# Patient Record
Sex: Male | Born: 1975 | Race: White | Hispanic: Yes | Marital: Married | Smoking: Never smoker
Health system: Southern US, Community
[De-identification: ages and names within clinical notes are randomized; demographics above are authoritative.]

## PROBLEM LIST (undated history)

## (undated) HISTORY — PX: ABDOMINAL SURGERY: SHX537

---

## 2012-07-26 ENCOUNTER — Emergency Department (HOSPITAL_COMMUNITY)
Admission: EM | Admit: 2012-07-26 | Discharge: 2012-07-27 | Disposition: A | Discharge status: Home / Self Care | Payer: PRIVATE HEALTH INSURANCE | Attending: Emergency Medicine | Admitting: Emergency Medicine

## 2012-07-26 ENCOUNTER — Encounter (HOSPITAL_COMMUNITY): Payer: Self-pay | Admitting: *Deleted

## 2012-07-26 DIAGNOSIS — R1013 Epigastric pain: Secondary | ICD-10-CM

## 2012-07-26 DIAGNOSIS — R109 Unspecified abdominal pain: Secondary | ICD-10-CM | POA: Insufficient documentation

## 2012-07-26 LAB — URINE MICROSCOPIC-ADD ON

## 2012-07-26 LAB — URINALYSIS, ROUTINE W REFLEX MICROSCOPIC
Bilirubin Urine: NEGATIVE
Leukocytes, UA: NEGATIVE
Nitrite: NEGATIVE
Specific Gravity, Urine: >1.03 — ABNORMAL HIGH (ref 1.005–1.030)
pH: 5.5 (ref 5.0–8.0)

## 2012-07-26 MED ORDER — KETOROLAC TROMETHAMINE 60 MG/2ML IM SOLN
60.0000 mg | Freq: Once | INTRAMUSCULAR | Status: AC
  Administered 2012-07-26: 60 mg via INTRAMUSCULAR
  Filled 2012-07-26: qty 2

## 2012-07-26 NOTE — ED Provider Notes (Signed)
History     CSN: [id number]  Arrival date & time 07/26/12  2126   First MD Initiated Contact with Patient 07/26/12 2334      Chief Complaint  Patient presents with  . Abdominal Pain    (Consider location/radiation/quality/duration/timing/severity/associated sxs/prior treatment) Patient is a 37 y.o. male presenting with abdominal pain. The history is provided by the patient.  Abdominal Pain Pain location:  Epigastric, LUQ and L flank Pain quality: sharp   Pain radiates to:  Back Pain severity:  Moderate Onset quality:  Gradual Duration:  4 weeks Timing:  Intermittent Progression:  Worsening Chronicity:  New Context: eating   Context: not alcohol use and not diet changes   Relieved by:  Nothing Worsened by:  Nothing tried Ineffective treatments:  None tried Associated symptoms: belching   Associated symptoms: no chills, no fever, no shortness of breath and no vomiting   Risk factors: no alcohol abuse and no NSAID use     History reviewed. No pertinent past medical history.  History reviewed. No pertinent past surgical history.  No family history on file.  History  Substance Use Topics  . Smoking status: Never Smoker   . Smokeless tobacco: Not on file  . Alcohol Use: Yes      Review of Systems  Constitutional: Negative for fever and chills.  Respiratory: Negative for shortness of breath.   Gastrointestinal: Positive for abdominal pain. Negative for vomiting.  All other systems reviewed and are negative.    Allergies  Review of patient's allergies indicates no known allergies.  Home Medications  No current outpatient prescriptions on file.  BP 130/73  Pulse 67  Temp(Src) 97.7 F (36.5 C) (Oral)  Resp 20  Ht 5\' 2"  (1.575 m)  Wt 189 lb (85.73 kg)  BMI 34.56 kg/m2  SpO2 99%  Physical Exam  Nursing note and vitals reviewed. Constitutional: He is oriented to person, place, and time. He appears well-developed and well-nourished. No distress.  HENT:   Head: Normocephalic and atraumatic.  Mouth/Throat: Oropharynx is clear and moist.  Neck: Normal range of motion. Neck supple.  Cardiovascular: Normal rate and regular rhythm.   No murmur heard. Pulmonary/Chest: Effort normal and breath sounds normal. No respiratory distress. He has no wheezes.  Abdominal: Soft. Bowel sounds are normal. He exhibits no distension. There is tenderness. There is no rebound and no guarding.  There is ttp in the epigastrium and left upper quadrant.    Musculoskeletal: Normal range of motion. He exhibits no edema.  Neurological: He is alert and oriented to person, place, and time.  Skin: Skin is warm and dry. He is not diaphoretic.    ED Course  Procedures (including critical care time)  Labs Reviewed  URINALYSIS, ROUTINE W REFLEX MICROSCOPIC - Abnormal; Notable for the following:    Specific Gravity, Urine >1.030 (*)    Hgb urine dipstick SMALL (*)    Protein, ur 30 (*)    All other components within normal limits  URINE MICROSCOPIC-ADD ON - Abnormal; Notable for the following:    Squamous Epithelial / LPF FEW (*)    All other components within normal limits  CBC WITH DIFFERENTIAL  COMPREHENSIVE METABOLIC PANEL  LIPASE, BLOOD   No results found.   No diagnosis found.    MDM  The patient presents with epigastric, LUQ, and left flank discomfort for the past several weeks. It is worse with eating and he belches a lot after he eats which seems to help.  The workup reveals  normal labs, ct scan, and urine that shows 7-10 wbc.  I suspect the symptoms are related to GERD / gastritis as the workup fails to reveal an alternate cause.  Will treat with prilosec, cipro and prn follow up if he worsens.          Sudie Grumbling, MD 07/27/12 [phone]

## 2012-07-26 NOTE — ED Notes (Signed)
Pain in left flank radiating into abdominal area, states pain is worse when he drinks water, states he has burning on urination

## 2012-07-26 NOTE — ED Notes (Signed)
Pt c/o LUQ pain that radiates to back. Pt states pain has been intermittent x2 months but this time it is worse. Pt denies NVD and chest pain.

## 2012-07-27 ENCOUNTER — Emergency Department (HOSPITAL_COMMUNITY): Payer: PRIVATE HEALTH INSURANCE

## 2012-07-27 LAB — CBC WITH DIFFERENTIAL/PLATELET
Basophils Absolute: 0 10*3/uL (ref 0.0–0.1)
Eosinophils Absolute: 0.3 10*3/uL (ref 0.0–0.7)
Eosinophils Relative: 3 % (ref 0–5)
HCT: 41 % (ref 39.0–52.0)
MCH: 28.8 pg (ref 26.0–34.0)
MCHC: 33.9 g/dL (ref 30.0–36.0)
MCV: 84.9 fL (ref 78.0–100.0)
Monocytes Absolute: 0.8 10*3/uL (ref 0.1–1.0)
Platelets: 161 10*3/uL (ref 150–400)
RDW: 13.2 % (ref 11.5–15.5)

## 2012-07-27 LAB — COMPREHENSIVE METABOLIC PANEL
ALT: 18 U/L (ref 0–53)
AST: 16 U/L (ref 0–37)
Calcium: 9.3 mg/dL (ref 8.4–10.5)
Creatinine, Ser: 0.72 mg/dL (ref 0.50–1.35)
GFR calc Af Amer: >90 mL/min (ref >90)
Sodium: 139 mEq/L (ref 135–145)
Total Protein: 7.5 g/dL (ref 6.0–8.3)

## 2012-07-27 MED ORDER — OMEPRAZOLE 20 MG PO CPDR: 20.0000 mg | DELAYED_RELEASE_CAPSULE | Freq: Two times a day (BID) | ORAL | Status: DC

## 2012-07-27 MED ORDER — CIPROFLOXACIN HCL 500 MG PO TABS: 500.0000 mg | ORAL_TABLET | Freq: Two times a day (BID) | ORAL | Status: DC

## 2013-07-08 ENCOUNTER — Encounter (HOSPITAL_COMMUNITY): Payer: Self-pay | Admitting: Emergency Medicine

## 2013-07-08 ENCOUNTER — Emergency Department (HOSPITAL_COMMUNITY): Payer: PRIVATE HEALTH INSURANCE

## 2013-07-08 ENCOUNTER — Inpatient Hospital Stay (HOSPITAL_COMMUNITY)
Admission: EM | Admit: 2013-07-08 | Discharge: 2013-07-10 | DRG: 390 | Disposition: A | Discharge status: Home / Self Care | Payer: PRIVATE HEALTH INSURANCE | Attending: General Surgery | Admitting: General Surgery

## 2013-07-08 DIAGNOSIS — K565 Intestinal adhesions [bands], unspecified as to partial versus complete obstruction: Secondary | ICD-10-CM | POA: Diagnosis not present

## 2013-07-08 DIAGNOSIS — K56600 Partial intestinal obstruction, unspecified as to cause: Secondary | ICD-10-CM | POA: Diagnosis present

## 2013-07-08 DIAGNOSIS — R112 Nausea with vomiting, unspecified: Secondary | ICD-10-CM | POA: Diagnosis not present

## 2013-07-08 NOTE — ED Notes (Signed)
Pt reporting generalized abdominal pain with associated nausea and vomiting, starting about noon today.

## 2013-07-09 ENCOUNTER — Emergency Department (HOSPITAL_COMMUNITY): Payer: PRIVATE HEALTH INSURANCE

## 2013-07-09 ENCOUNTER — Encounter (HOSPITAL_COMMUNITY): Payer: Self-pay | Admitting: *Deleted

## 2013-07-09 DIAGNOSIS — R112 Nausea with vomiting, unspecified: Secondary | ICD-10-CM | POA: Diagnosis present

## 2013-07-09 DIAGNOSIS — K56600 Partial intestinal obstruction, unspecified as to cause: Secondary | ICD-10-CM | POA: Diagnosis present

## 2013-07-09 DIAGNOSIS — K565 Intestinal adhesions [bands], unspecified as to partial versus complete obstruction: Secondary | ICD-10-CM | POA: Diagnosis present

## 2013-07-09 LAB — COMPREHENSIVE METABOLIC PANEL
ALBUMIN: 4.6 g/dL (ref 3.5–5.2)
ALK PHOS: 95 U/L (ref 39–117)
ALT: 14 U/L (ref 0–53)
AST: 16 U/L (ref 0–37)
BUN: 20 mg/dL (ref 6–23)
CO2: 29 mEq/L (ref 19–32)
CREATININE: 0.6 mg/dL (ref 0.50–1.35)
Calcium: 10.3 mg/dL (ref 8.4–10.5)
Chloride: 98 mEq/L (ref 96–112)
GFR calc Af Amer: >90 mL/min (ref >90)
GFR calc non Af Amer: >90 mL/min (ref >90)
Glucose, Bld: 137 mg/dL — ABNORMAL HIGH (ref 70–99)
POTASSIUM: 3.5 meq/L — AB (ref 3.7–5.3)
Sodium: 140 mEq/L (ref 137–147)
TOTAL PROTEIN: 8.5 g/dL — AB (ref 6.0–8.3)
Total Bilirubin: 0.6 mg/dL (ref 0.3–1.2)

## 2013-07-09 LAB — URINALYSIS, ROUTINE W REFLEX MICROSCOPIC
Bilirubin Urine: NEGATIVE
Glucose, UA: NEGATIVE mg/dL
Hgb urine dipstick: NEGATIVE
Ketones, ur: NEGATIVE mg/dL
LEUKOCYTES UA: NEGATIVE
NITRITE: NEGATIVE
PH: 6 (ref 5.0–8.0)
Protein, ur: 100 mg/dL — AB
Urobilinogen, UA: 0.2 mg/dL (ref 0.0–1.0)

## 2013-07-09 LAB — LIPASE, BLOOD: LIPASE: 19 U/L (ref 11–59)

## 2013-07-09 LAB — URINE MICROSCOPIC-ADD ON

## 2013-07-09 LAB — CBC
HEMATOCRIT: 44.5 % (ref 39.0–52.0)
Hemoglobin: 15.1 g/dL (ref 13.0–17.0)
MCH: 28.5 pg (ref 26.0–34.0)
MCHC: 33.9 g/dL (ref 30.0–36.0)
MCV: 84.1 fL (ref 78.0–100.0)
Platelets: 177 10*3/uL (ref 150–400)
RBC: 5.29 MIL/uL (ref 4.22–5.81)
RDW: 13.7 % (ref 11.5–15.5)
WBC: 9.4 10*3/uL (ref 4.0–10.5)

## 2013-07-09 MED ORDER — ONDANSETRON HCL 4 MG/2ML IJ SOLN
4.0000 mg | Freq: Once | INTRAMUSCULAR | Status: AC
  Administered 2013-07-09: 4 mg via INTRAVENOUS
  Filled 2013-07-09: qty 2

## 2013-07-09 MED ORDER — OXYCODONE HCL 5 MG PO TABS
5.0000 mg | ORAL_TABLET | ORAL | Status: DC | PRN
  Administered 2013-07-09 (×2): 5 mg via ORAL
  Filled 2013-07-09 (×2): qty 1

## 2013-07-09 MED ORDER — ACETAMINOPHEN 325 MG PO TABS: 650.0000 mg | ORAL_TABLET | Freq: Four times a day (QID) | ORAL | Status: DC | PRN

## 2013-07-09 MED ORDER — DIPHENHYDRAMINE HCL 50 MG/ML IJ SOLN
12.5000 mg | Freq: Four times a day (QID) | INTRAMUSCULAR | Status: DC | PRN
Start: 2013-07-09 — End: 2013-07-10

## 2013-07-09 MED ORDER — ONDANSETRON HCL 4 MG/2ML IJ SOLN: 4.0000 mg | Freq: Four times a day (QID) | INTRAMUSCULAR | Status: DC | PRN

## 2013-07-09 MED ORDER — ACETAMINOPHEN 650 MG RE SUPP: 650.0000 mg | Freq: Four times a day (QID) | RECTAL | Status: DC | PRN

## 2013-07-09 MED ORDER — FENTANYL CITRATE 0.05 MG/ML IJ SOLN: 50.0000 ug | INTRAMUSCULAR | Status: DC | PRN

## 2013-07-09 MED ORDER — FENTANYL CITRATE 0.05 MG/ML IJ SOLN
50.0000 ug | INTRAMUSCULAR | Status: DC | PRN
  Administered 2013-07-09 (×2): 50 ug via INTRAVENOUS
  Filled 2013-07-09 (×2): qty 2

## 2013-07-09 MED ORDER — SODIUM CHLORIDE 0.9 % IV SOLN
INTRAVENOUS | Status: DC
  Administered 2013-07-09: 04:00:00 via INTRAVENOUS

## 2013-07-09 MED ORDER — ONDANSETRON HCL 4 MG/2ML IJ SOLN: 4.0000 mg | Freq: Three times a day (TID) | INTRAMUSCULAR | Status: DC | PRN

## 2013-07-09 MED ORDER — SODIUM CHLORIDE 0.9 % IV SOLN
INTRAVENOUS | Status: DC
  Administered 2013-07-09 (×2): via INTRAVENOUS

## 2013-07-09 MED ORDER — ENOXAPARIN SODIUM 40 MG/0.4ML ~~LOC~~ SOLN
40.0000 mg | Freq: Every day | SUBCUTANEOUS | Status: DC
  Administered 2013-07-09: 40 mg via SUBCUTANEOUS
  Filled 2013-07-09: qty 0.4

## 2013-07-09 MED ORDER — DIPHENHYDRAMINE HCL 12.5 MG/5ML PO ELIX
12.5000 mg | ORAL_SOLUTION | Freq: Four times a day (QID) | ORAL | Status: DC | PRN
Start: 2013-07-09 — End: 2013-07-10

## 2013-07-09 MED ORDER — IOHEXOL 300 MG/ML  SOLN
100.0000 mL | Freq: Once | INTRAMUSCULAR | Status: AC | PRN
  Administered 2013-07-09: 100 mL via INTRAVENOUS

## 2013-07-09 MED ORDER — IOHEXOL 300 MG/ML  SOLN
50.0000 mL | Freq: Once | INTRAMUSCULAR | Status: AC | PRN
  Administered 2013-07-09: 50 mL via ORAL

## 2013-07-09 MED ORDER — KCL IN DEXTROSE-NACL 20-5-0.45 MEQ/L-%-% IV SOLN
INTRAVENOUS | Status: DC
  Administered 2013-07-09 – 2013-07-10 (×2): via INTRAVENOUS

## 2013-07-09 MED ORDER — HYDROMORPHONE HCL PF 1 MG/ML IJ SOLN
1.0000 mg | INTRAMUSCULAR | Status: DC | PRN
  Administered 2013-07-09 (×2): 1 mg via INTRAVENOUS
  Filled 2013-07-09 (×3): qty 1

## 2013-07-09 NOTE — ED Provider Notes (Signed)
CSN: [id number]     Arrival date & time 07/08/13  2305 History  This chart was scribed for Bryce Nielsen, MD by Bennett Scrape, ED Scribe. This patient was seen in room APA05/APA05 and the patient's care was started at 12:00 AM.    Chief Complaint  Patient presents with  . Abdominal Pain     Patient is a 38 y.o. male presenting with abdominal pain. The history is provided by the patient. No language interpreter was used.  Abdominal Pain Pain location:  LUQ and RUQ Pain radiates to:  Does not radiate Pain severity:  Moderate Onset quality:  Gradual Duration:  12 hours Timing:  Constant Chronicity:  New Associated symptoms: chills, nausea and vomiting   Associated symptoms: no chest pain, no diarrhea, no fever and no shortness of breath     HPI Comments: Bryce Wilson is a 38 y.o. male who presents to the Emergency Department complaining of RUQ and LUQ abdominal pain that started around 12 PM today with associated nausea, emesis and chills. He is having pain currently. Pain is sharp in quality moderate to severe. No known alleviating factors. He denies any sick contacts with similar symptoms. He denies any fevers, hematemesis, hematochezia, back pain or diarrhea. Family reports prior abdominal sugery after the pt fell off of a roof and had states he had internal bleeding with surgery in November at [facility]. He denies complications since surgery. He is not uncertain about any further details of the surgery.    History reviewed. No pertinent past medical history. Past Surgical History  Procedure Laterality Date  . Abdominal surgery     History reviewed. No pertinent family history. History  Substance Use Topics  . Smoking status: Never Smoker   . Smokeless tobacco: Not on file  . Alcohol Use: No    Review of Systems  Constitutional: Positive for chills. Negative for fever.  Respiratory: Negative for shortness of breath.   Cardiovascular: Negative for chest pain.   Gastrointestinal: Positive for nausea, vomiting and abdominal pain. Negative for diarrhea and blood in stool.  Genitourinary: Negative for flank pain.  Musculoskeletal: Negative for back pain.  Skin: Negative for rash.  Neurological: Negative for weakness and numbness.  All other systems reviewed and are negative.    Allergies  Review of patient's allergies indicates no known allergies. Morphine causes hyperemesis per pt at bedside  Home Medications   Current Outpatient Rx  Name  Route  Sig  Dispense  Refill  . ciprofloxacin (CIPRO) 500 MG tablet   Oral   Take 1 tablet (500 mg total) by mouth every 12 (twelve) hours.   10 tablet   0   . omeprazole (PRILOSEC) 20 MG capsule   Oral   Take 1 capsule (20 mg total) by mouth 2 (two) times daily.   60 capsule   0    Triage Vitals: BP 131/81  Pulse 63  Temp(Src) 98.1 F (36.7 C) (Oral)  Resp 20  Ht 5\' 3"  (1.6 m)  Wt 165 lb (74.844 kg)  BMI 29.24 kg/m2  SpO2 98%  Physical Exam  Nursing note and vitals reviewed. Constitutional: He is oriented to person, place, and time. He appears well-developed and well-nourished. No distress.  HENT:  Head: Normocephalic and atraumatic.  Eyes: EOM are normal.  Neck: Neck supple. No tracheal deviation present.  Cardiovascular: Normal rate and regular rhythm.   Pulmonary/Chest: Effort normal and breath sounds normal. No respiratory distress.  Abdominal: Soft. There is tenderness (epigastric, LUQ and RUQ).  No CVAT, no lower abdominal tenderness   Musculoskeletal: Normal range of motion.  Neurological: He is alert and oriented to person, place, and time.  Skin: Skin is warm and dry.  Psychiatric: He has a normal mood and affect. His behavior is normal.    ED Course  Procedures (including critical care time)  DIAGNOSTIC STUDIES: Oxygen Saturation is 98% on RA, normal by my interpretation.    COORDINATION OF CARE: 12:02 AM-Discussed treatment plan which includes abdomen x-ray, CBC  panel, CMP and UA with pt at bedside and pt agreed to plan.   Labs Review Labs Reviewed  COMPREHENSIVE METABOLIC PANEL - Abnormal; Notable for the following:    Potassium 3.5 (*)    Glucose, Bld 137 (*)    Total Protein 8.5 (*)    All other components within normal limits  URINALYSIS, ROUTINE W REFLEX MICROSCOPIC - Abnormal; Notable for the following:    Specific Gravity, Urine >1.030 (*)    Protein, ur 100 (*)    All other components within normal limits  URINE MICROSCOPIC-ADD ON - Abnormal; Notable for the following:    Squamous Epithelial / LPF FEW (*)    Bacteria, UA FEW (*)    All other components within normal limits  CBC  LIPASE, BLOOD   Imaging Review Ct Abdomen Pelvis W Contrast  07/09/2013   CLINICAL DATA:  Upper abdominal pain and emesis.  EXAM: CT ABDOMEN AND PELVIS WITH CONTRAST  TECHNIQUE: Multidetector CT imaging of the abdomen and pelvis was performed using the standard protocol following bolus administration of intravenous contrast.  CONTRAST:  50mL OMNIPAQUE IOHEXOL 300 MG/ML SOLN, OMNIPAQUE IOHEXOL 300 MG/ML SOLN  COMPARISON:  DG ABD ACUTE W/CHEST dated 07/08/2013; CT ABD/PELV WO CM dated 07/27/2012  FINDINGS: Minimal dependent atelectasis is noted in the lung bases. There is no pleural effusion.  The liver, gallbladder, spleen, right adrenal gland, kidneys, and pancreas have an unremarkable enhanced appearance. Slight thickening of the left adrenal gland is unchanged.  The stomach is moderately distended with oral contrast material. Oral contrast is present in multiple loops of proximal small bowel which are dilated up to 3.8 cm in diameter. There is a transition to decompressed small bowel which occurs in the central abdomen. There is a small to moderate amount of stool in the colon.  No intraperitoneal free fluid or free air is identified. The bladder is unremarkable. No enlarged lymph nodes are identified. No acute osseous abnormality is seen.  IMPRESSION: Dilated  proximal small bowel loops with decompressed distal small bowel, concerning for partial obstruction.   Electronically Signed   By: Sebastian Ache   On: 07/09/2013 02:03   Dg Abd Acute W/chest  07/09/2013   CLINICAL DATA:  Abdominal pain and emesis.  EXAM: ACUTE ABDOMEN SERIES (ABDOMEN 2 VIEW & CHEST 1 VIEW)  COMPARISON:  CT ABD/PELV WO CM dated 07/27/2012  FINDINGS: Cardiomediastinal silhouette is unremarkable. Lungs are clear, no pleural effusions. Mildly elevated right hemidiaphragm. No pneumothorax. Soft tissue planes and included osseous structures are unremarkable.  Bowel gas pattern is nondilated and nonobstructive. Overall paucity of small bowel gas is not specific. No intra-abdominal mass effect, pathologic calcifications or free air. Soft tissue planes and included osseous structures are nonsuspicious.  IMPRESSION: Nonspecific bowel gas pattern .  No acute cardiopulmonary disease.   Electronically Signed   By: Awilda Metro   On: 07/09/2013 00:18   IV fluids provided. IV fentanyl. IV Zofran. On recheck still having some pain but nausea resolved.  Discussed with Dr. Lovell SheehanJenkins, plan admit MedSurg - will hold off on NG tube at this time.  MDM   Diagnosis: Partial small bowel obstruction  Evaluated with labs and imaging reviewed as above. Treated with IV fluids and IV narcotics. Nausea improved with IV Zofran.  I personally performed the services described in this documentation, which was scribed in my presence. The recorded information has been reviewed and is accurate.    Bryce NielsenBrian Deion Swift, MD 07/09/13 [phone]

## 2013-07-09 NOTE — ED Notes (Signed)
Patient ambulatory to restroom with steady gait, clean catch instructions given and advised pt to bring specimen back to room as well.  

## 2013-07-09 NOTE — H&P (Signed)
Bryce Wilson is an 38 y.o. male.   Chief Complaint: Small bowel obstruction HPI: Patient is a 38 year old Hispanic male status post exploratory laparotomy for intra-abdominal bleeding after sustaining a fall from a roof last November 2014 who presents with a 48 hour history of worsening abdominal distention and pain, nausea, vomiting. He states he has had flatus within the last 24 hours. Did have a bowel movement yesterday. He presented to the [facility] emergency room for further evaluation treatment. 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 He also states he has had an open appendectomy in the remote past. Currently, his abdominal pain has resolved and he only complains of intermittent crampy abdominal pain.  History reviewed. No pertinent past medical history.  Past Surgical History  Procedure Laterality Date  . Abdominal surgery      History reviewed. No pertinent family history. Social History:  reports that he has never smoked. He does not have any smokeless tobacco history on file. He reports that he does not drink alcohol or use illicit drugs.  Allergies: No Known Allergies  No prescriptions prior to admission    Results for orders placed during the hospital encounter of 07/08/13 (from the past 48 hour(s))  CBC     Status: None   Collection Time    07/09/13 12:05 AM      Result Value Ref Range   WBC 9.4  4.0 - 10.5 K/uL   RBC 5.29  4.22 - 5.81 MIL/uL   Hemoglobin 15.1  13.0 - 17.0 g/dL   HCT 44.5  39.0 - 52.0 %   MCV 84.1  78.0 - 100.0 fL   MCH 28.5  26.0 - 34.0 pg   MCHC 33.9  30.0 - 36.0 g/dL   RDW 13.7  11.5 - 15.5 %   Platelets 177  150 - 400 K/uL  COMPREHENSIVE METABOLIC PANEL     Status: Abnormal   Collection Time    07/09/13 12:05 AM      Result Value Ref Range   Sodium 140  137 - 147 mEq/L   Potassium 3.5 (*) 3.7 - 5.3 mEq/L   Chloride 98  96 - 112 mEq/L   CO2 29  19 - 32 mEq/L   Glucose, Bld 137  (*) 70 - 99 mg/dL   BUN 20  6 - 23 mg/dL   Creatinine, Ser 0.60  0.50 - 1.35 mg/dL   Calcium 10.3  8.4 - 10.5 mg/dL   Total Protein 8.5 (*) 6.0 - 8.3 g/dL   Albumin 4.6  3.5 - 5.2 g/dL   AST 16  0 - 37 U/L   ALT 14  0 - 53 U/L   Alkaline Phosphatase 95  39 - 117 U/L   Total Bilirubin 0.6  0.3 - 1.2 mg/dL   GFR calc non Af Amer >90  >90 mL/min   GFR calc Af Amer >90  >90 mL/min   Comment: (NOTE)     The eGFR has been calculated using the CKD EPI equation.     This calculation has not been validated in all clinical situations.     eGFR's persistently <90 mL/min signify possible Chronic Kidney     Disease.  LIPASE, BLOOD     Status: None   Collection Time    07/09/13 12:05 AM      Result Value Ref Range   Lipase 19  11 - 59 U/L  URINALYSIS, ROUTINE W REFLEX MICROSCOPIC  Status: Abnormal   Collection Time    07/09/13  1:05 AM      Result Value Ref Range   Color, Urine YELLOW  YELLOW   APPearance CLEAR  CLEAR   Specific Gravity, Urine >1.030 (*) 1.005 - 1.030   pH 6.0  5.0 - 8.0   Glucose, UA NEGATIVE  NEGATIVE mg/dL   Hgb urine dipstick NEGATIVE  NEGATIVE   Bilirubin Urine NEGATIVE  NEGATIVE   Ketones, ur NEGATIVE  NEGATIVE mg/dL   Protein, ur 100 (*) NEGATIVE mg/dL   Urobilinogen, UA 0.2  0.0 - 1.0 mg/dL   Nitrite NEGATIVE  NEGATIVE   Leukocytes, UA NEGATIVE  NEGATIVE  URINE MICROSCOPIC-ADD ON     Status: Abnormal   Collection Time    07/09/13  1:05 AM      Result Value Ref Range   Squamous Epithelial / LPF FEW (*) RARE   WBC, UA 0-2  <3 WBC/hpf   RBC / HPF 0-2  <3 RBC/hpf   Bacteria, UA FEW (*) RARE   Urine-Other MUCOUS PRESENT     Ct Abdomen Pelvis W Contrast  07/09/2013   CLINICAL DATA:  Upper abdominal pain and emesis.  EXAM: CT ABDOMEN AND PELVIS WITH CONTRAST  TECHNIQUE: Multidetector CT imaging of the abdomen and pelvis was performed using the standard protocol following bolus administration of intravenous contrast.  CONTRAST:  82m OMNIPAQUE IOHEXOL 300 MG/ML  SOLN, 1055mOMNIPAQUE IOHEXOL 300 MG/ML SOLN  COMPARISON:  DG ABD ACUTE W/CHEST dated 07/08/2013; CT ABD/PELV WO CM dated 07/27/2012  FINDINGS: Minimal dependent atelectasis is noted in the lung bases. There is no pleural effusion.  The liver, gallbladder, spleen, right adrenal gland, kidneys, and pancreas have an unremarkable enhanced appearance. Slight thickening of the left adrenal gland is unchanged.  The stomach is moderately distended with oral contrast material. Oral contrast is present in multiple loops of proximal small bowel which are dilated up to 3.8 cm in diameter. There is a transition to decompressed small bowel which occurs in the central abdomen. There is a small to moderate amount of stool in the colon.  No intraperitoneal free fluid or free air is identified. The bladder is unremarkable. No enlarged lymph nodes are identified. No acute osseous abnormality is seen.  IMPRESSION: Dilated proximal small bowel loops with decompressed distal small bowel, concerning for partial obstruction.   Electronically Signed   By: AlLogan Bores On: 07/09/2013 02:03   Dg Abd Acute W/chest  07/09/2013   CLINICAL DATA:  Abdominal pain and emesis.  EXAM: ACUTE ABDOMEN SERIES (ABDOMEN 2 VIEW & CHEST 1 VIEW)  COMPARISON:  CT ABD/PELV WO CM dated 07/27/2012  FINDINGS: Cardiomediastinal silhouette is unremarkable. Lungs are clear, no pleural effusions. Mildly elevated right hemidiaphragm. No pneumothorax. Soft tissue planes and included osseous structures are unremarkable.  Bowel gas pattern is nondilated and nonobstructive. Overall paucity of small bowel gas is not specific. No intra-abdominal mass effect, pathologic calcifications or free air. Soft tissue planes and included osseous structures are nonsuspicious.  IMPRESSION: Nonspecific bowel gas pattern .  No acute cardiopulmonary disease.   Electronically Signed   By: CoElon Alas On: 07/09/2013 00:18    Review of Systems  Constitutional: Positive for  malaise/fatigue.  HENT: Negative.   Respiratory: Negative.   Cardiovascular: Negative.   Gastrointestinal: Positive for nausea, vomiting and abdominal pain.  Genitourinary: Negative.   Musculoskeletal: Negative.   Skin: Negative.   All other systems reviewed and are negative.  Blood pressure 110/67, pulse 63, temperature 97.9 F (36.6 C), temperature source Oral, resp. rate 20, height '5\' 3"'  (1.6 m), weight 79.606 kg (175 lb 8 oz), SpO2 96.00%. Physical Exam  Vitals reviewed. Constitutional: He is oriented to person, place, and time. He appears well-developed and well-nourished.  HENT:  Head: Normocephalic and atraumatic.  Neck: Normal range of motion. Neck supple.  Cardiovascular: Normal rate, regular rhythm and normal heart sounds.   Respiratory: Effort normal and breath sounds normal.  GI: Soft. Bowel sounds are normal. He exhibits no distension. There is no rebound and no guarding.  Minimally tender in the midportion the abdomen. No rigidity is noted. Bowel sounds are appreciated.  Neurological: He is alert and oriented to person, place, and time.  Skin: Skin is warm and dry.     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 No need for NG tube decompression at this time. No need for acute intervention at this time. Discussed the situation with the patient and family, who understand and agree to the treatment plan.  Janine Reller A 07/09/2013, 12:11 PM

## 2013-07-09 NOTE — Progress Notes (Signed)
UR completed. Patient changed to inpaitent- SBO requiring IVF @ 125cc/hr

## 2013-07-10 LAB — CBC
HCT: 37.5 % — ABNORMAL LOW (ref 39.0–52.0)
HEMOGLOBIN: 12.4 g/dL — AB (ref 13.0–17.0)
MCH: 28.3 pg (ref 26.0–34.0)
MCHC: 33.1 g/dL (ref 30.0–36.0)
MCV: 85.6 fL (ref 78.0–100.0)
Platelets: 138 10*3/uL — ABNORMAL LOW (ref 150–400)
RBC: 4.38 MIL/uL (ref 4.22–5.81)
RDW: 13.7 % (ref 11.5–15.5)
WBC: 4.2 10*3/uL (ref 4.0–10.5)

## 2013-07-10 LAB — BASIC METABOLIC PANEL
BUN: 12 mg/dL (ref 6–23)
CHLORIDE: 104 meq/L (ref 96–112)
CO2: 30 meq/L (ref 19–32)
CREATININE: 0.76 mg/dL (ref 0.50–1.35)
Calcium: 8.8 mg/dL (ref 8.4–10.5)
GFR calc non Af Amer: >90 mL/min (ref >90)
Glucose, Bld: 106 mg/dL — ABNORMAL HIGH (ref 70–99)
POTASSIUM: 4 meq/L (ref 3.7–5.3)
Sodium: 142 mEq/L (ref 137–147)

## 2013-07-10 LAB — MAGNESIUM: Magnesium: 1.8 mg/dL (ref 1.5–2.5)

## 2013-07-10 LAB — PHOSPHORUS: Phosphorus: 4.3 mg/dL (ref 2.3–4.6)

## 2013-07-10 NOTE — Progress Notes (Signed)
Pt appears to be resting comfortably, vss, no c/o pain, low bed, hob self regulated, call bell at pt's side, will cont to monitor 

## 2013-07-10 NOTE — Discharge Instructions (Signed)
Obstruccin intestinal  (Intestinal Obstruction)  Una obstruccin intestinal es el bloqueo del intestino. Las causas pueden ser un bloqueo fsico o una dificultad en la funcin anormal del intestino.  CAUSAS   Adherencias de cirugas previas.  Cncer o un tumor.  Una hernia, que es una afeccin en la que una porcin del intestino sobresale a travs de una abertura o una zona dbil en el abdomen. Esto a veces comprime el intestino.  Un objeto que se ha tragado.  El bloqueo (taponamiento) con gusanos es frecuente en los pases del tercer mundo.  Neomia DearUna torcedura o plegamiento de una porcin del intestino dentro de otra porcin (invaginacin)  Cualquier cosa que se detenga el avance la comida desde el estmago al ano. SNTOMAS  Los sntomas de obstruccin intestinal pueden ser: hinchazn abdominal, nuseas, vmitos, diarrea explosiva, o heces explosivas. Su mdico hablar con usted sobre el mejor curso de accin a Careers advisertomar. Posiblemente no escuche los sonidos intestinales habituales (como "gruidos en el estmago "). Tambin puede dejar de tener movimientos intestinales o de eliminar gases.  DIAGNSTICO  Por lo general, esta afeccin se diagnostica con una historia clnica y un examen fsico. Podrn pedirle estudios de laboratorio (anlisis de Os[place]angre ) y radiografas para Veterinary surgeonencontrar la causa.  TRATAMIENTO  El principal tratamiento es Psychologist, educationalhacer descansar el intestino. Muchas veces la obstruccin mejora por s misma y permite al intestino comenzar a trabajar de nuevo. Piense en el intestino como un globo que se ha inflado (lleno de comida y agua atrapada que se ha metido en un agujero o rea que no puede Engineer, agriculturalatravesar).   Si la obstruccin es [place], ser necesario pasar una sonda nasogstrica a travs de la nariz hasta el [place]estmago. Luego se conecta para succionar y Teaching laboratory technicianvaciar el estmago. Tambin mejora las nuseas y los vmitos.  Si hay un desequilibrio en los electrolitos, se corrigen con lquidos por va  intravenosa. Estos lquidos contienen las sustancias qumicos adecuadas para Systems developercorregir el problema.  Si el motivo de la obstruccin no se alivia con Artistel tratamiento conservador (no quirrgico), puede ser Baker Janusnecesaria una ciruga. En algunos casos, la Azerbaijanciruga se realiza de inmediato si el cirujano sabe que el problema no se va a mejorar con Artistel tratamiento conservador. PRONSTICO  Segn cul sea el problema, en la Harley-Davidsonmayora de los casos el tratamiento mdico da buenos Pastoriaresultados. El Biomedical engineermdico le comentar cul ser el mejor curso de accin.  LUEGO DE LA CIRUGA  Busque atencin mdica de inmediato si usted presenta:   Aumento del dolor abdominal, tiene vmitos reiterados, deshidratacin o se desmaya.  Debilidad intensa, dolor en el pecho o en la espalda.  Sangre en el vmito o en las heces.  Heces de aspecto alquitranado. Document Released: 04/03/2005 Document Revised: 07/29/2012 [facility] Patient Information 2014 [place], [place].

## 2013-07-10 NOTE — Progress Notes (Signed)
Pt appears to be resting comfortably, vss, no c/o pain, low bed, hob self regulated, call bell at pt's side, willl cont to monitor

## 2013-07-10 NOTE — Progress Notes (Signed)
Discharge instructions given to patient who verbalized understanding, out in stable condition ambulatory with staff.

## 2013-07-10 NOTE — Discharge Summary (Signed)
Physician Discharge Summary  Patient ID: Bryce Wilson MRN: [id number] DOB/AGE: 1975-07-09 38 y.o.  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 CT scan of the abdomen revealed a partial small bowel traction. This was most likely secondary to adhesive disease from a previous exploratory laparotomy done in 2014 at another facility. He did not require an NG tube. His diet was advanced without difficulty once his bowel function returned. He is being discharged home on 07/10/2013 in good improving condition. He did not require surgery.  Discharge Exam: Blood pressure 112/69, pulse 48, temperature 97.7 F (36.5 C), temperature source Oral, resp. rate 20, height 5\' 3"  (1.6 m), weight 79.606 kg (175 lb 8 oz), SpO2 98.00%. General appearance: alert, cooperative and no distress Resp: clear to auscultation bilaterally Cardio: regular rate and rhythm, S1, S2 normal, no murmur, click, rub or gallop GI: soft, non-tender; bowel sounds normal; no masses,  no organomegaly  Disposition: 01-Home or Self Care     Medication List    Notice   You have not been prescribed any medications.         Follow-up Information   Follow up with Dalia HeadingJENKINS,Ilda Laskin A, MD. (As needed)    Specialty:  General Surgery   Contact information:   [address] [place] [id number] [phone]       Signed: Franky MachoJENKINS,Keiasia Christianson A 07/10/2013, 9:09 AM

## 2013-07-10 NOTE — Care Management Note (Signed)
    Page 1 of 1   07/10/2013     11:17:15 AM   CARE MANAGEMENT NOTE 07/10/2013  Patient:  Bryce Wilson,Bryce   Account Number:  [id number]  Date Initiated:  07/10/2013  Documentation initiated by:  Rosemary HolmsOBSON,Chino Sardo  Subjective/Objective Assessment:   Pt admitted from home with wife. No HH DME needs identified.     Action/Plan:   Anticipated DC Date:  07/10/2013   Anticipated DC Plan:  HOME/SELF CARE      DC Planning Services  CM consult      Choice offered to / List presented to:             Status of service:  Completed, signed off Medicare Important Message given?   (If response is "NO", the following Medicare IM given date fields will be blank) Date Medicare IM given:   Date Additional Medicare IM given:    Discharge Disposition:  HOME/SELF CARE  Per UR Regulation:    If discussed at Long Length of Stay Meetings, dates discussed:    Comments:  07/10/13 Rosemary HolmsAmy Ezriel Boffa RN BSN CM

## 2014-03-10 IMAGING — CT CT ABD-PELV W/O CM
2 of 3 series · 9 of 46 positions shown, 11 images · non-contrast
Comparison: None.

CLINICAL DATA: Left flank pain.  Dysuria.

CT ABDOMEN AND PELVIS WITHOUT CONTRAST
TECHNIQUE: Multidetector CT imaging of the abdomen and pelvis was
performed following the standard protocol without intravenous
contrast.

[Series 4: mpr coronal (id) · coronal · 0.77mm/px · 8 of 97 slices shown, 9 images]
[im 11/97  soft-tissue]
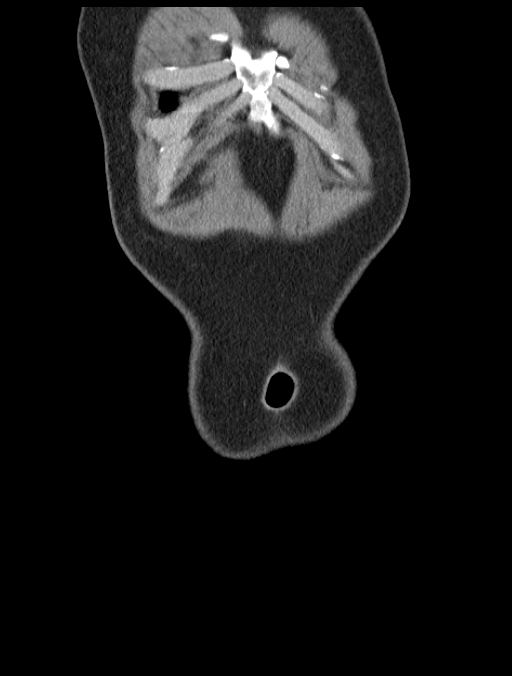
[im 11/97  bone]
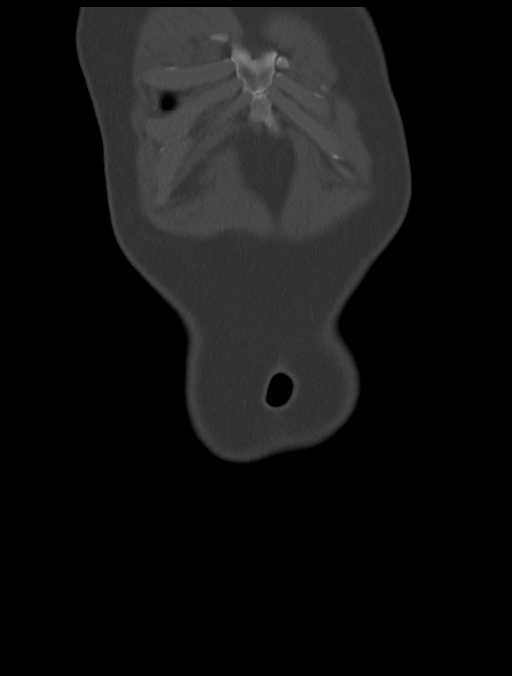
[im 22/97  soft-tissue]
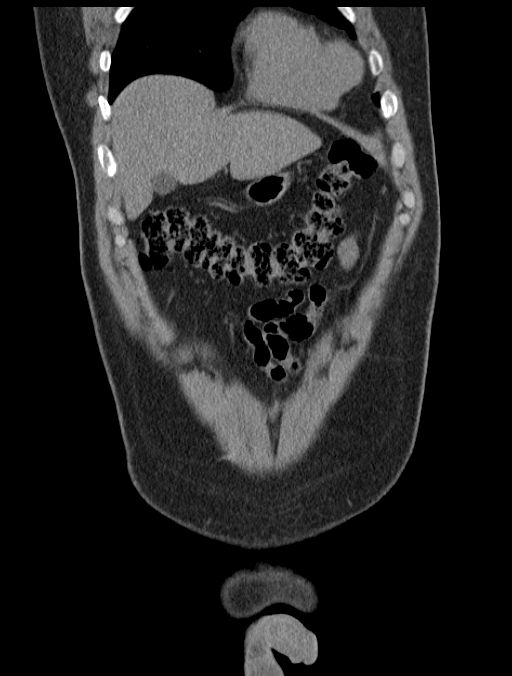
[im 33/97  soft-tissue]
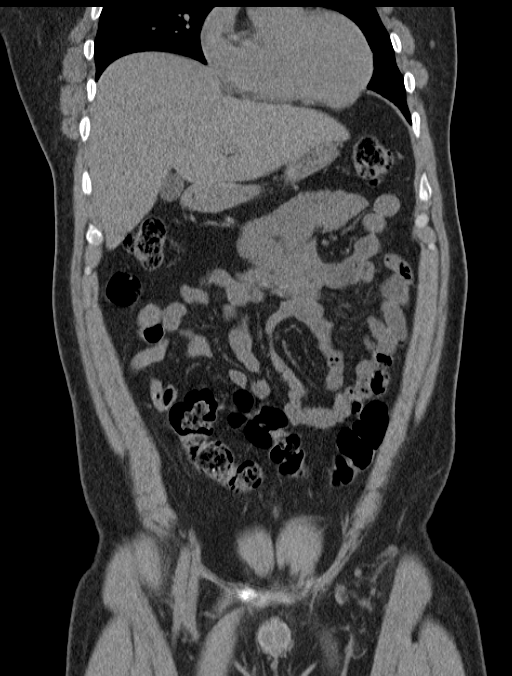
[im 43/97  soft-tissue]
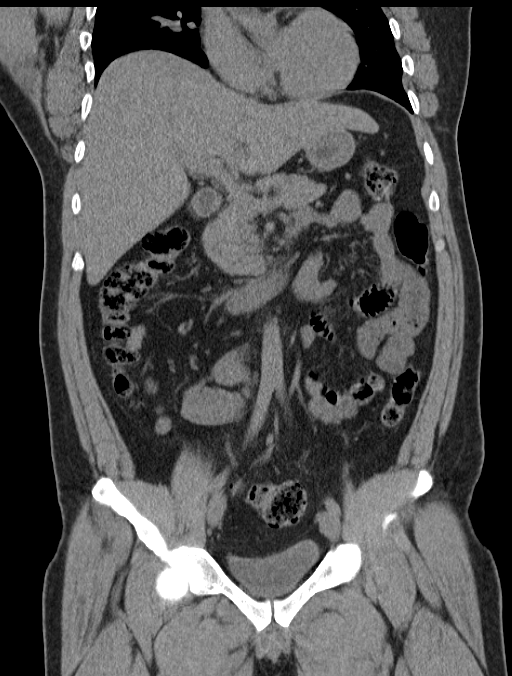
[im 54/97  soft-tissue]
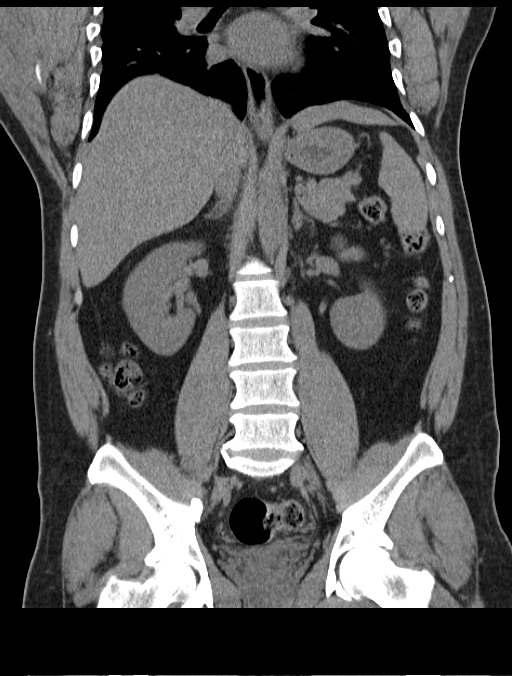
[im 65/97  soft-tissue]
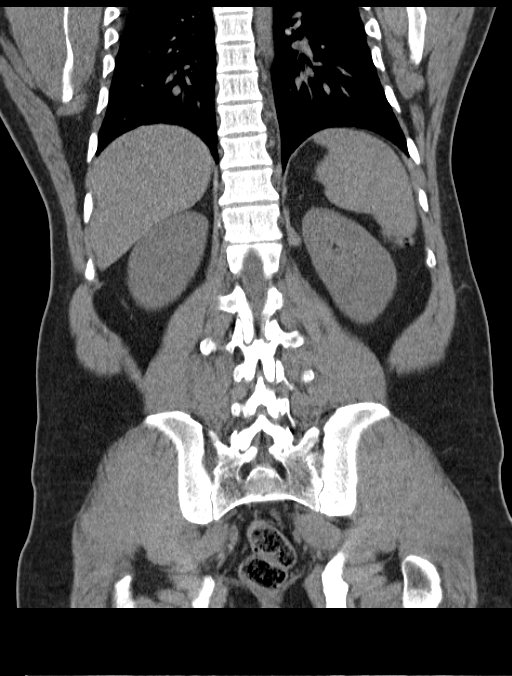
[im 75/97  soft-tissue]
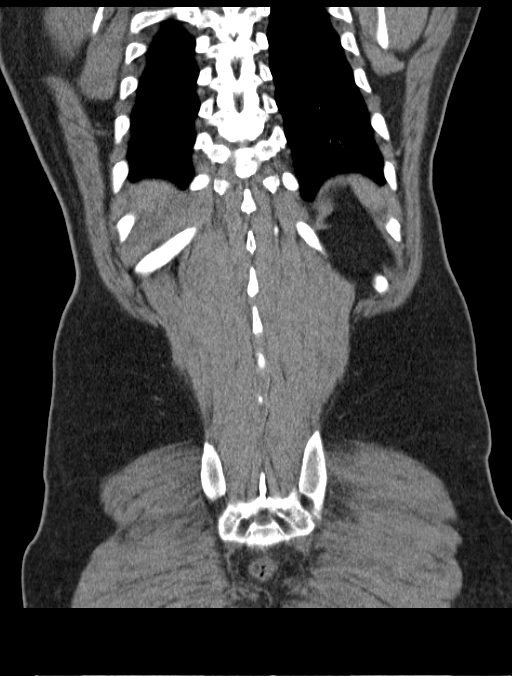
[im 86/97  soft-tissue]
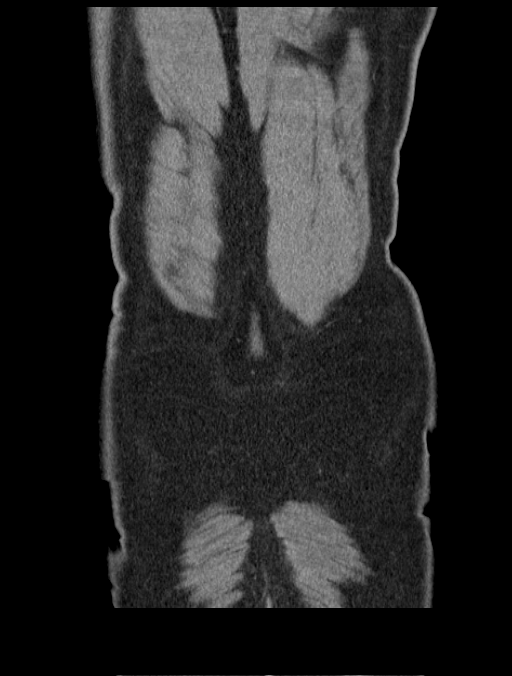

[Series 5: mpr sagittal (id) · sagittal · 0.57mm/px · 1 of 133 slices shown, 2 images]
[im 45/133  soft-tissue]
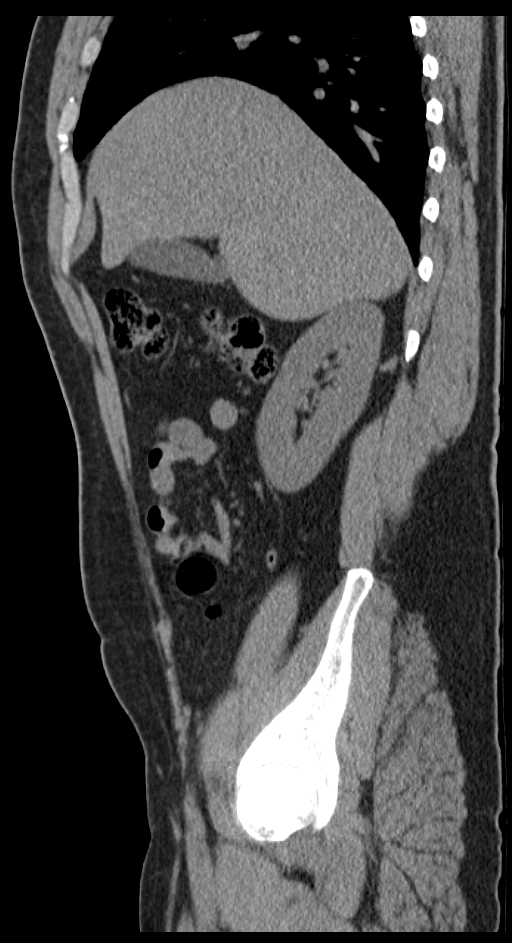
[im 45/133  bone]
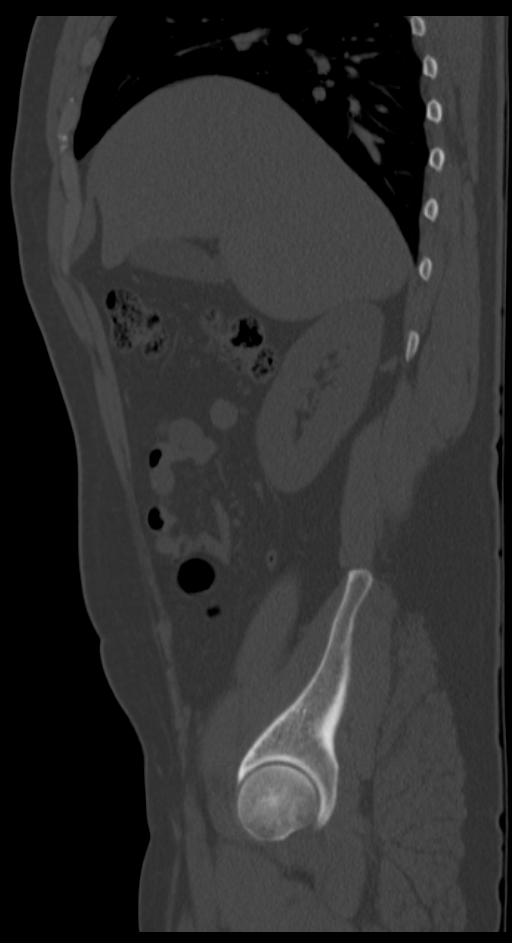

[9 of 46 positions shown; findings below may reference images not displayed]

FINDINGS: No evidence of renal calculi or hydronephrosis.  No
evidence of ureteral calculi or dilatation.  No bladder calculi
identified.

The liver, gallbladder, spleen, pancreas, and adrenal glands have a
normal appearance on this noncontrast study.  No soft tissue masses
or lymphadenopathy identified.  No evidence of inflammatory process
or abnormal fluid collections.  No evidence of dilated bowel loops
or hernia.
IMPRESSION: No evidence of urolithiasis, hydronephrosis, or other acute
findings.
